# Patient Record
Sex: Male | Born: 1970 | Race: Black or African American | Hispanic: No | Marital: Single | Smoking: Never smoker
Health system: Southern US, Community
[De-identification: ages and names within clinical notes are randomized; demographics above are authoritative.]

## PROBLEM LIST (undated history)

## (undated) DIAGNOSIS — M109 Gout, unspecified: Secondary | ICD-10-CM

## (undated) DIAGNOSIS — E119 Type 2 diabetes mellitus without complications: Secondary | ICD-10-CM

## (undated) DIAGNOSIS — I10 Essential (primary) hypertension: Secondary | ICD-10-CM

## (undated) HISTORY — DX: Gout, unspecified: M10.9

## (undated) HISTORY — DX: Essential (primary) hypertension: I10

---

## 2008-02-13 ENCOUNTER — Emergency Department: Payer: Self-pay | Admitting: Emergency Medicine

## 2016-01-13 ENCOUNTER — Emergency Department
Admission: EM | Admit: 2016-01-13 | Discharge: 2016-01-13 | Disposition: A | Discharge status: Home / Self Care | Payer: BLUE CROSS/BLUE SHIELD | Attending: Emergency Medicine | Admitting: Emergency Medicine

## 2016-01-13 ENCOUNTER — Emergency Department: Payer: BLUE CROSS/BLUE SHIELD

## 2016-01-13 ENCOUNTER — Encounter: Payer: Self-pay | Admitting: Emergency Medicine

## 2016-01-13 DIAGNOSIS — M79661 Pain in right lower leg: Secondary | ICD-10-CM

## 2016-01-13 DIAGNOSIS — R252 Cramp and spasm: Secondary | ICD-10-CM

## 2016-01-13 DIAGNOSIS — E119 Type 2 diabetes mellitus without complications: Secondary | ICD-10-CM | POA: Insufficient documentation

## 2016-01-13 HISTORY — DX: Type 2 diabetes mellitus without complications: E11.9

## 2016-01-13 LAB — CBC WITH DIFFERENTIAL/PLATELET
BASOS ABS: 0.1 10*3/uL (ref 0–0.1)
Basophils Relative: 1 %
EOS ABS: 0.2 10*3/uL (ref 0–0.7)
Eosinophils Relative: 2 %
HCT: 41.5 % (ref 40.0–52.0)
Hemoglobin: 13.5 g/dL (ref 13.0–18.0)
LYMPHS ABS: 1.5 10*3/uL (ref 1.0–3.6)
LYMPHS PCT: 19 %
MCH: 24.2 pg — ABNORMAL LOW (ref 26.0–34.0)
MCHC: 32.5 g/dL (ref 32.0–36.0)
MCV: 74.3 fL — ABNORMAL LOW (ref 80.0–100.0)
MONO ABS: 0.8 10*3/uL (ref 0.2–1.0)
Monocytes Relative: 10 %
NEUTROS ABS: 5.3 10*3/uL (ref 1.4–6.5)
Neutrophils Relative %: 68 %
PLATELETS: 182 10*3/uL (ref 150–440)
RBC: 5.58 MIL/uL (ref 4.40–5.90)
RDW: 15.5 % — AB (ref 11.5–14.5)
WBC: 7.9 10*3/uL (ref 3.8–10.6)

## 2016-01-13 LAB — COMPREHENSIVE METABOLIC PANEL
ALBUMIN: 3.9 g/dL (ref 3.5–5.0)
ALK PHOS: 95 U/L (ref 38–126)
ALT: 26 U/L (ref 17–63)
ANION GAP: 10 (ref 5–15)
AST: 21 U/L (ref 15–41)
BILIRUBIN TOTAL: 0.4 mg/dL (ref 0.3–1.2)
BUN: 11 mg/dL (ref 6–20)
CALCIUM: 9.1 mg/dL (ref 8.9–10.3)
CO2: 29 mmol/L (ref 22–32)
CREATININE: 0.96 mg/dL (ref 0.61–1.24)
Chloride: 102 mmol/L (ref 101–111)
GFR calc Af Amer: >60 mL/min (ref >60)
GFR calc non Af Amer: >60 mL/min (ref >60)
GLUCOSE: 212 mg/dL — AB (ref 65–99)
Potassium: 3.7 mmol/L (ref 3.5–5.1)
Sodium: 141 mmol/L (ref 135–145)
TOTAL PROTEIN: 7.7 g/dL (ref 6.5–8.1)

## 2016-01-13 MED ORDER — CYCLOBENZAPRINE HCL 5 MG PO TABS: 5.0000 mg | ORAL_TABLET | Freq: Three times a day (TID) | ORAL | Status: DC | PRN

## 2016-01-13 NOTE — ED Notes (Signed)
Pt to ed with c/o right calf pain.  Pt states it started yesterday, feels like a cramp.  No bruising or swelling noted.

## 2016-01-13 NOTE — ED Provider Notes (Signed)
[facility] Emergency Department Provider Note  ____________________________________________  Time seen: Approximately 1:03 PM  I have reviewed the triage vital signs and the nursing notes.   HISTORY  Chief Complaint Leg Pain    HPI Brent Gibson is a 45 y.o. male, NAD, presents to the emergency department with a 1 day history of right calf pain. He states it feels like a cramp that will not subside. He has a history of gout and thought that this might be a different presentation of gout and took colchicine and indomethicin this morning. 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 He denies injury, traumas nor falls. Denies fever, chills, body aches. Has had no numbness, weakness, tingling, redness, swelling, chest pain, cough, or dyspnea. Is diabetic and notes he takes all but his metformin on a daily basis. Does not take the metformin due to diarrhea as a side effect. Last appointment with his PCP was late March 2017.    Past Medical History  Diagnosis Date  . Diabetes mellitus without complication (HCC)     There are no active problems to display for this patient.   History reviewed. No pertinent past surgical history.  Current Outpatient Rx  Name  Route  Sig  Dispense  Refill  . cyclobenzaprine (FLEXERIL) 5 MG tablet   Oral   Take 1 tablet (5 mg total) by mouth every 8 (eight) hours as needed for muscle spasms.   21 tablet   0     Allergies Review of patient's allergies indicates no known allergies.  History reviewed. No pertinent family history.  Social History Social History  Substance Use Topics  . Smoking status: Never Smoker   . Smokeless tobacco: None  . Alcohol Use: No     Review of Systems  Constitutional: No fever/chills, fatigue Eyes: No visual changes.  Cardiovascular: No chest pain, palpitations. Respiratory: No cough. No shortness of breath. No wheezing.  Gastrointestinal: No abdominal pain.  No  nausea, vomiting.   Musculoskeletal: Positive for right calf pain. Negative for back pain, ankle pain, foot pain.  Skin: Negative for rash, redness, swelling, skin sores. Neurological: Negative for headaches, focal weakness or numbness. No tingling.    ____________________________________________   PHYSICAL EXAM:  VITAL SIGNS: ED Triage Vitals  Enc Vitals Group     BP 01/13/16 1132 177/87 mmHg     Pulse --      Resp 01/13/16 1132 18     Temp 01/13/16 1132 99.1 F (37.3 C)     Temp Source 01/13/16 1132 Oral     SpO2 01/13/16 1132 94 %     Weight 01/13/16 1132 375 lb (170.099 kg)     Height 01/13/16 1132 6' (1.829 m)     Head Cir --      Peak Flow --      Pain Score 01/13/16 1155 10     Pain Loc --      Pain Edu? --      Excl. in GC? --     Constitutional: Alert and oriented. Well appearing and in no acute distress. Eyes: Conjunctivae are normal. PERRL. EOMI without pain.  Head: Atraumatic. Hematological/Lymphatic/Immunilogical: No cervical lymphadenopathy. Cardiovascular: Normal rate, regular rhythm. Normal S1 and S2.  Right foot was slightly cooler in temperature as compared to the left foot. Pulses difficult to palpate in the right foot but noted to be 1+. Capillary refill was <3 seconds in the right foot, toes and ankle.  Respiratory: Normal respiratory effort without tachypnea or retractions. Lungs CTAB with breath sounds noted in all lung fields. Musculoskeletal: No tenderness to palpation of bilateral lower extremities. Pain upon weight bearing and walking in the right calf. Full ROM of motion of the right toes, foot, ankle, knee, hip without pain. Negative hoaman's sign. No edema or erythema noted in either leg. No joint effusions. Neurologic:  Normal speech and language. No gross focal neurologic deficits are appreciated. Gait with slight limp favoring right leg. Sensation to light touch grossly in tact about the right lower extremity.  Skin:  Skin is warm, dry and  intact. No rash, skin sores, open wounds noted. Psychiatric: Mood and affect are normal. Speech and behavior are normal. Patient exhibits appropriate insight and judgement.  ____________________________________________   LABS (all labs ordered are listed, but only abnormal results are displayed)  Labs Reviewed  COMPREHENSIVE METABOLIC PANEL - Abnormal; Notable for the following:    Glucose, Bld 212 (*)    All other components within normal limits  CBC WITH DIFFERENTIAL/PLATELET - Abnormal; Notable for the following:    MCV 74.3 (*)    MCH 24.2 (*)    RDW 15.5 (*)    All other components within normal limits   ____________________________________________  EKG  None ____________________________________________  RADIOLOGY I have personally viewed and evaluated these images (plain radiographs) as part of my medical decision making, as well as reviewing the written report by the radiologist.  US Venous Img Lower Unilateral Right  01/13/2016  CLINICAL DATA:  45 year old male with a history of right calf pain EXAM: RIGHT LOWER EXTREMITY VENOUS DOPPLER ULTRASOUND TECHNIQUE: Gray-scale sonography with graded compression, as well as color Doppler and duplex ultrasound were performed to evaluate the lower extremity deep venous systems from the level of the common femoral vein and including the common femoral, femoral, profunda femoral, popliteal and calf veins including the posterior tibial, peroneal and gastrocnemius veins when visible. The superficial great saphenous vein was also interrogated. Spectral Doppler was utilized to evaluate flow at rest and with distal augmentation maneuvers in the common femoral, femoral and popliteal veins. COMPARISON:  None. FINDINGS: Contralateral Common Femoral Vein: Respiratory phasicity is normal and symmetric with the symptomatic side. No evidence of thrombus. Normal compressibility. Common Femoral Vein: No evidence of thrombus. Normal compressibility,  respiratory phasicity and response to augmentation. Saphenofemoral Junction: No evidence of thrombus. Normal compressibility and flow on color Doppler imaging. Profunda Femoral Vein: No evidence of thrombus. Normal compressibility and flow on color Doppler imaging. Femoral Vein: No evidence of thrombus. Normal compressibility, respiratory phasicity and response to augmentation. Popliteal Vein: No evidence of thrombus. Normal compressibility, respiratory phasicity and response to augmentation. Calf Veins: No evidence of thrombus. Normal compressibility and flow on color Doppler imaging. Superficial Great Saphenous Vein: No evidence of thrombus. Normal compressibility and flow on color Doppler imaging. Other Findings:  None. IMPRESSION: Sonographic survey of the right lower extremity negative for DVT. Signed, Yvone Neu. Loreta Ave, DO [facility] Electronically Signed   By: Gilmer Mor D.O.   On: 01/13/2016 14:51    ____________________________________________   INITIAL IMPRESSION / ASSESSMENT AND PLAN / ED COURSE  Pertinent labs & imaging results that were available during my care of the patient were reviewed by me and considered in my medical decision making (see chart for details).  Patient's diagnosis is consistent with muscle cramp and right calf pain. Patient will be discharged home with prescriptions for Flexeril  to take  as directed. 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 Patient is to follow up with his PCP if symptoms persist past this treatment course. Patient is given ED precautions to return to the ED for any worsening or new symptoms.    ____________________________________________  FINAL CLINICAL IMPRESSION(S) / ED DIAGNOSES  Final diagnoses:  Muscle cramps  Calf pain, right      NEW MEDICATIONS STARTED DURING THIS VISIT:  Discharge Medication List as of  01/13/2016  2:56 PM    START taking these medications   Details  cyclobenzaprine (FLEXERIL) 5 MG tablet Take 1 tablet (5 mg total) by mouth every 8 (eight) hours as needed for muscle spasms., Starting 01/13/2016, Until Discontinued, Print             Hope PigeonJami L Anakin Varkey, PA-C 01/13/16 1534  Rockne MenghiniAnne-Caroline Norman, MD 01/16/16 [phone]

## 2016-01-13 NOTE — Discharge Instructions (Signed)
Leg Cramps Leg cramps occur when a muscle or muscles tighten and you have no control over this tightening (involuntary muscle contraction). Muscle cramps can develop in any muscle, but the most common place is in the calf muscles of the leg. Those cramps can occur during exercise or when you are at rest. Leg cramps are painful, and they may last for a few seconds to a few minutes. Cramps may return several times before they finally stop. Usually, leg cramps are not caused by a serious medical problem. In many cases, the cause is not known. Some common causes include:  Overexertion.  Overuse from repetitive motions, or doing the same thing over and over.  Remaining in a certain position for a long period of time.  Improper preparation, form, or technique while performing a sport or an activity.  Dehydration.  Injury.  Side effects of some medicines.  Abnormally low levels of the salts and ions in your blood (electrolytes), especially potassium and calcium. These levels could be low if you are taking water pills (diuretics) or if you are pregnant. HOME CARE INSTRUCTIONS Watch your condition for any changes. Taking the following actions may help to lessen any discomfort that you are feeling:  Stay well-hydrated. Drink enough fluid to keep your urine clear or pale yellow.  Try massaging, stretching, and relaxing the affected muscle. Do this for several minutes at a time.  For tight or tense muscles, use a warm towel, heating pad, or hot shower water directed to the affected area.  If you are sore or have pain after a cramp, applying ice to the affected area may relieve discomfort.  Put ice in a plastic bag.  Place a towel between your skin and the bag.  Leave the ice on for 20 minutes, 2-3 times per day.  Avoid strenuous exercise for several days if you have been having frequent leg cramps.  Make sure that your diet includes the essential minerals for your muscles to work  normally.  Take medicines only as directed by your health care provider. SEEK MEDICAL CARE IF:  Your leg cramps get more severe or more frequent, or they do not improve over time.  Your foot becomes cold, numb, or blue.   This information is not intended to replace advice given to you by your health care provider. Make sure you discuss any questions you have with your health care provider.   Document Released: 10/07/2004 Document Revised: 01/14/2015 Document Reviewed: 08/07/2014 Elsevier Interactive Patient Education 2016 Elsevier Inc.  [employer] Therapy Heat therapy can help ease sore, stiff, injured, and tight muscles and joints. Heat relaxes your muscles, which may help ease your pain. Heat therapy should only be used on old, pre-existing, or long-lasting (chronic) injuries. Do not use heat therapy unless told by your doctor. HOW TO USE HEAT THERAPY There are several different kinds of heat therapy, including:  Moist heat pack.  Warm water bath.  Hot water bottle.  Electric heating pad.  Heated gel pack.  Heated wrap.  Electric heating pad. GENERAL HEAT THERAPY RECOMMENDATIONS   Do not sleep while using heat therapy. Only use heat therapy while you are awake.  Your skin may turn pink while using heat therapy. Do not use heat therapy if your skin turns red.  Do not use heat therapy if you have new pain.  High heat or long exposure to heat can cause burns. Be careful when using heat therapy to avoid burning your skin.  Do not use heat  therapy on areas of your skin that are already irritated, such as with a rash or sunburn. GET HELP IF:   You have blisters, redness, swelling (puffiness), or numbness.  You have new pain.  Your pain is worse. MAKE SURE YOU:  Understand these instructions.  Will watch your condition.  Will get help right away if you are not doing well or get worse.   This information is not intended to replace advice given to you by your health care  provider. Make sure you discuss any questions you have with your health care provider.   Document Released: 11/22/2011 Document Revised: 09/20/2014 Document Reviewed: 10/23/2013 Elsevier Interactive Patient Education [employer] Elsevier Inc.

## 2016-01-13 NOTE — ED Notes (Signed)
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  No warmth, redness, swelling noted to area.

## 2016-01-19 ENCOUNTER — Encounter: Payer: Self-pay | Admitting: Emergency Medicine

## 2016-01-19 ENCOUNTER — Emergency Department
Admission: EM | Admit: 2016-01-19 | Discharge: 2016-01-19 | Disposition: A | Discharge status: Home / Self Care | Payer: BLUE CROSS/BLUE SHIELD | Attending: Emergency Medicine | Admitting: Emergency Medicine

## 2016-01-19 DIAGNOSIS — M79604 Pain in right leg: Secondary | ICD-10-CM | POA: Diagnosis present

## 2016-01-19 DIAGNOSIS — E119 Type 2 diabetes mellitus without complications: Secondary | ICD-10-CM | POA: Insufficient documentation

## 2016-01-19 DIAGNOSIS — L03115 Cellulitis of right lower limb: Secondary | ICD-10-CM | POA: Insufficient documentation

## 2016-01-19 LAB — CBC WITH DIFFERENTIAL/PLATELET
Basophils Absolute: 0 10*3/uL (ref 0–0.1)
Eosinophils Absolute: 0.1 10*3/uL (ref 0–0.7)
HEMATOCRIT: 41.9 % (ref 40.0–52.0)
Hemoglobin: 13.5 g/dL (ref 13.0–18.0)
Lymphocytes Relative: 11 %
Lymphs Abs: 1.3 10*3/uL (ref 1.0–3.6)
MCH: 23.9 pg — ABNORMAL LOW (ref 26.0–34.0)
MCHC: 32.1 g/dL (ref 32.0–36.0)
MCV: 74.5 fL — AB (ref 80.0–100.0)
MONO ABS: 1.3 10*3/uL — AB (ref 0.2–1.0)
NEUTROS ABS: 9.2 10*3/uL — AB (ref 1.4–6.5)
Neutrophils Relative %: 77 %
PLATELETS: 223 10*3/uL (ref 150–440)
RBC: 5.62 MIL/uL (ref 4.40–5.90)
RDW: 15.2 % — AB (ref 11.5–14.5)
WBC: 11.9 10*3/uL — ABNORMAL HIGH (ref 3.8–10.6)

## 2016-01-19 LAB — COMPREHENSIVE METABOLIC PANEL
ALBUMIN: 3.5 g/dL (ref 3.5–5.0)
ALK PHOS: 117 U/L (ref 38–126)
ALT: 40 U/L (ref 17–63)
ANION GAP: 11 (ref 5–15)
AST: 37 U/L (ref 15–41)
BILIRUBIN TOTAL: 0.8 mg/dL (ref 0.3–1.2)
BUN: 16 mg/dL (ref 6–20)
CALCIUM: 9.1 mg/dL (ref 8.9–10.3)
CO2: 31 mmol/L (ref 22–32)
Chloride: 93 mmol/L — ABNORMAL LOW (ref 101–111)
Creatinine, Ser: 1.19 mg/dL (ref 0.61–1.24)
GFR calc Af Amer: >60 mL/min (ref >60)
GFR calc non Af Amer: >60 mL/min (ref >60)
GLUCOSE: 299 mg/dL — AB (ref 65–99)
Potassium: 3.8 mmol/L (ref 3.5–5.1)
SODIUM: 135 mmol/L (ref 135–145)
TOTAL PROTEIN: 8.5 g/dL — AB (ref 6.5–8.1)

## 2016-01-19 LAB — MAGNESIUM: Magnesium: 2.1 mg/dL (ref 1.7–2.4)

## 2016-01-19 MED ORDER — OXYCODONE-ACETAMINOPHEN 5-325 MG PO TABS: 1.0000 | ORAL_TABLET | ORAL | Status: DC | PRN

## 2016-01-19 MED ORDER — OXYCODONE-ACETAMINOPHEN 5-325 MG PO TABS
2.0000 | ORAL_TABLET | Freq: Once | ORAL | Status: AC
  Administered 2016-01-19: 2 via ORAL
  Filled 2016-01-19: qty 2

## 2016-01-19 MED ORDER — CLINDAMYCIN HCL 150 MG PO CAPS: 300.0000 mg | ORAL_CAPSULE | Freq: Four times a day (QID) | ORAL | Status: DC

## 2016-01-19 NOTE — ED Notes (Signed)
Reports pain and swelling in right calf.  +PMS

## 2016-01-19 NOTE — ED Notes (Signed)
Was seen last week with pain to right lower leg  Had u/s done at that time  Which was negative. Now having pain and increased swelling to lower leg

## 2016-01-19 NOTE — ED Provider Notes (Signed)
[facility] Emergency Department Provider Note   ____________________________________________  Time seen: Approximately 4:52 PM  I have reviewed the triage vital signs and the nursing notes.   HISTORY  Chief Complaint Leg Pain   HPI Brent Gibson is a 45 y.o. male is here with complaint of right lower leg pain. Patient states he was seen in the emergency room on 5/2 at which time he had ultrasound tests that ruled out a blood clot in his leg. Patient has taken Flexeril without any relief. Patient reports that his leg feels "like I'm having a cramp". He denies any trauma or previous problems with his leg. Patient states he has a history of gout however he points to the catheter of his leg when he is describing his pain.Patient denies any fever or chills. There's been no nausea or vomiting. He denies any shortness of breath. Initially he took colchicine and indomethacin thinking that this was gout which did not give him any relief. Patient is non-insulin-dependent diabetic and takes       Currently he rates his pain as a 10 over 10 and he has not followed up with his primary care doctor since his visit in the emergency room.   Past Medical History  Diagnosis Date  . Diabetes mellitus without complication (HCC)     There are no active problems to display for this patient.   History reviewed. No pertinent past surgical history.  Current Outpatient Rx  Name  Route  Sig  Dispense  Refill  . clindamycin (CLEOCIN) 150 MG capsule   Oral   Take 2 capsules (300 mg total) by mouth 4 (four) times daily.   56 capsule   0   . cyclobenzaprine (FLEXERIL) 5 MG tablet   Oral   Take 1 tablet (5 mg total) by mouth every 8 (eight) hours as needed for muscle spasms.   21 tablet   0   . oxyCODONE-acetaminophen (PERCOCET) 5-325 MG tablet   Oral   Take 1 tablet by mouth every 4 (four) hours as needed for severe pain.   20 tablet   0     Allergies Review of  patient's allergies indicates no known allergies.  History reviewed. No pertinent family history.  Social History Social History  Substance Use Topics  . Smoking status: Never Smoker   . Smokeless tobacco: None  . Alcohol Use: No    Review of Systems Constitutional: No fever/chills Cardiovascular: Denies chest pain. Respiratory: Denies shortness of breath. Gastrointestinal: No abdominal pain.  No nausea, no vomiting.  No diarrhea.  No constipation. Genitourinary: Negative for dysuria. Musculoskeletal: Negative for back pain. Positive for right leg pain. Skin: Negative for rash. Neurological: Negative for headaches, focal weakness. Positive for diabetic neuropathy bilateral feet.  10-point ROS otherwise negative.  ____________________________________________   PHYSICAL EXAM:  VITAL SIGNS: ED Triage Vitals  Enc Vitals Group     BP 01/19/16 1641 146/89 mmHg     Pulse Rate 01/19/16 1641 123     Resp 01/19/16 1641 20     Temp 01/19/16 1641 98.9 F (37.2 C)     Temp src --      SpO2 --      Weight 01/19/16 1641 375 lb (170.099 kg)     Height 01/19/16 1641 6' (1.829 m)     Head Cir --      Peak Flow --      Pain Score 01/19/16 1642 10     Pain Loc --  Pain Edu? --      Excl. in GC? --     Constitutional: Alert and oriented. Well appearing and in no acute distress. Eyes: Conjunctivae are normal. PERRL. EOMI. Head: Atraumatic. Nose: No congestion/rhinnorhea. Neck: No stridor.   Cardiovascular: Normal rate, regular rhythm. Grossly normal heart sounds.  Good peripheral circulation. Respiratory: Normal respiratory effort.  No retractions. Lungs CTAB. Gastrointestinal: Soft and nontender. No distention. No abdominal bruits.  Musculoskeletal:Right leg no gross deformity. There is minimal swelling in comparison to the left calf however there is some slight warmth to the skin. There is no increased pain on pressure and palpation. Homans sign is negative. There is no skin  eruption or drainage noted. There is no obvious infection of the right foot. Neurologic:  Normal speech and language. Mild neurological deficit bilateral feet secondary to diabetic neuropathy. Skin:  Skin is warm, dry and intact. No rash noted.No ecchymosis, abrasions. There is some mild warmth noted to the right calf without skin injury obvious. Psychiatric: Mood and affect are normal. Speech and behavior are normal.  ____________________________________________   LABS (all labs ordered are listed, but only abnormal results are displayed)  Labs Reviewed  COMPREHENSIVE METABOLIC PANEL - Abnormal; Notable for the following:    Chloride 93 (*)    Glucose, Bld 299 (*)    Total Protein 8.5 (*)    All other components within normal limits  CBC WITH DIFFERENTIAL/PLATELET - Abnormal; Notable for the following:    WBC 11.9 (*)    MCV 74.5 (*)    MCH 23.9 (*)    RDW 15.2 (*)    Neutro Abs 9.2 (*)    Monocytes Absolute 1.3 (*)    All other components within normal limits  MAGNESIUM    PROCEDURES  Procedure(s) performed: None  Critical Care performed: No  ____________________________________________   INITIAL IMPRESSION / ASSESSMENT AND PLAN / ED COURSE  Pertinent labs & imaging results that were available during my care of the patient were reviewed by me and considered in my medical decision making (see chart for details).  ----------------------------------------- 7:02 PM on 01/19/2016 ----------------------------------------- Patient states that pain medication has relieved his pain considerably. Patient was informed of lab work and the elevation in his white count.Patient had ultrasound evaluation of his right leg which was negative for DVT. He is instructed to follow-up with his primary doctor at [facility] clinic this week. He is aware that he may return to the emergency room if any severe worsening of his symptoms or fever above 101. Patient was placed on clindamycin 300 mg 4 times  a day and Percocet as needed for pain. He was instructed to use warm compresses and elevate when possible.  ____________________________________________   FINAL CLINICAL IMPRESSION(S) / ED DIAGNOSES  Final diagnoses:  Cellulitis of right leg without foot      NEW MEDICATIONS STARTED DURING THIS VISIT:  Discharge Medication List as of 01/19/2016  6:56 PM    START taking these medications   Details  clindamycin (CLEOCIN) 150 MG capsule Take 2 capsules (300 mg total) by mouth 4 (four) times daily., Starting 01/19/2016, Until Discontinued, Print    oxyCODONE-acetaminophen (PERCOCET) 5-325 MG tablet Take 1 tablet by mouth every 4 (four) hours as needed for severe pain., Starting 01/19/2016, Until Discontinued, Print         Note:  This document was prepared using Dragon voice recognition software and may include unintentional dictation errors.    Tommi Rumps, PA-C 01/19/16 1920  Loraine Leriche  Fanny BienQuale, MD 01/19/16 2153

## 2016-01-19 NOTE — Discharge Instructions (Signed)
Cellulitis Cellulitis is an infection of the skin and the tissue under the skin. The infected area is usually red and tender. This happens most often in the arms and lower legs. HOME CARE   Take your antibiotic medicine as told. Finish the medicine even if you start to feel better.  Keep the infected arm or leg raised (elevated).  Put a warm cloth on the area up to 4 times per day.  Only take medicines as told by your doctor.  Keep all doctor visits as told. GET HELP IF:  You see red streaks on the skin coming from the infected area.  Your red area gets bigger or turns a dark color.  Your bone or joint under the infected area is painful after the skin heals.  Your infection comes back in the same area or different area.  You have a puffy (swollen) bump in the infected area.  You have new symptoms.  You have a fever. GET HELP RIGHT AWAY IF:   You feel very sleepy.  You throw up (vomit) or have watery poop (diarrhea).  You feel sick and have muscle aches and pains.   This information is not intended to replace advice given to you by your health care provider. Make sure you discuss any questions you have with your health care provider.   Document Released: 02/16/2008 Document Revised: 05/21/2015 Document Reviewed: 11/15/2011 Elsevier Interactive Patient Education 2016 [employer]    [facility]. Warm compresses to your leg frequently. Begin taking clindamycin for infection. Percocet as needed for pain. Return to the emergency room today severe worsening of your symptoms or fever above 101.

## 2016-06-30 IMAGING — US US EXTREM LOW VENOUS*R*
1 series · 13 of 24 positions shown · non-contrast
Comparison: None.

CLINICAL DATA: 45-year-old male with a history of right calf pain



[Series 1: us extrem low venous*right* · 0.11mm/px · 13 of 34 slices shown]
[im 1/34]
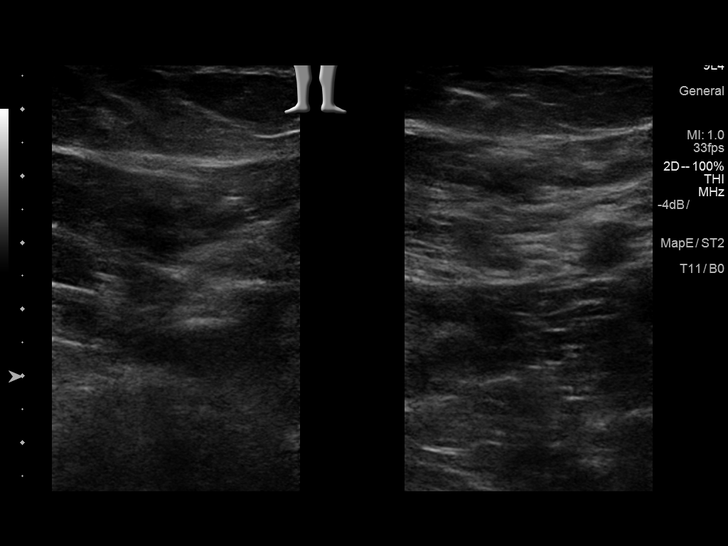
[im 3/34]
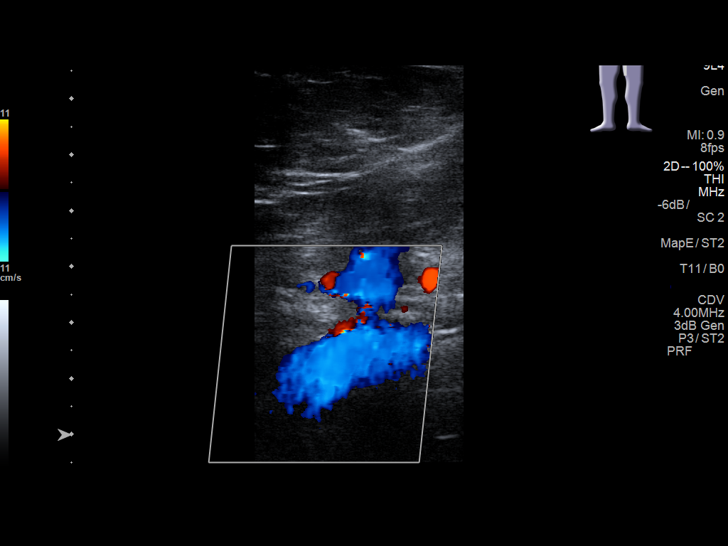
[im 6/34]
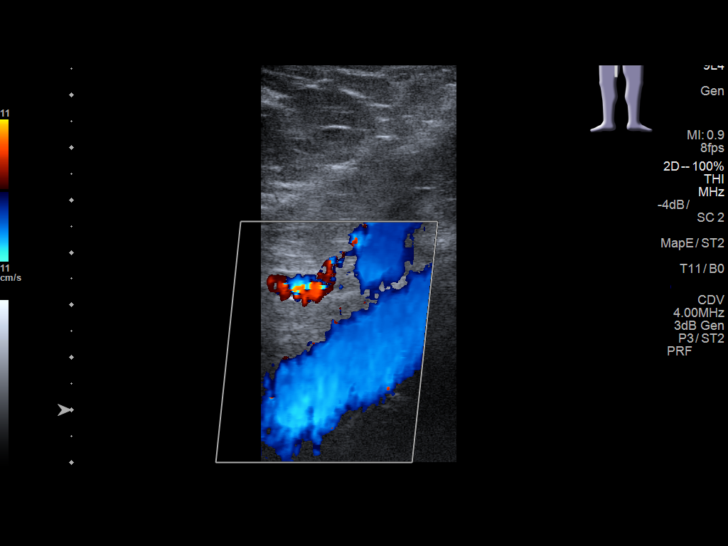
[im 9/34]
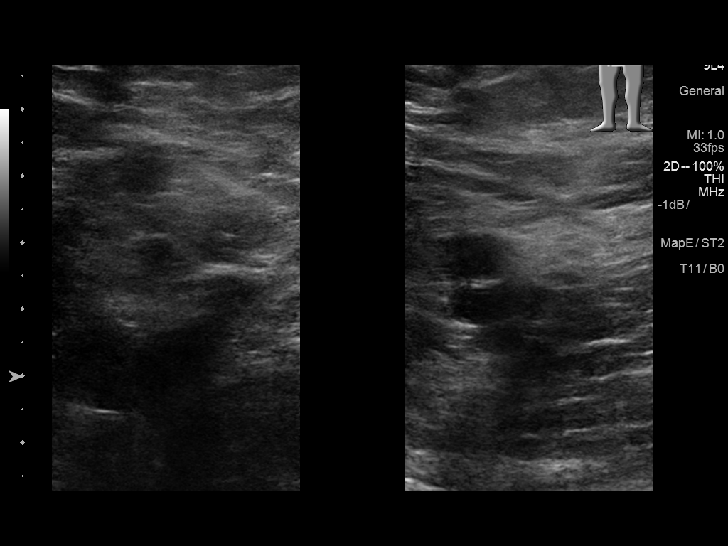
[im 12/34]
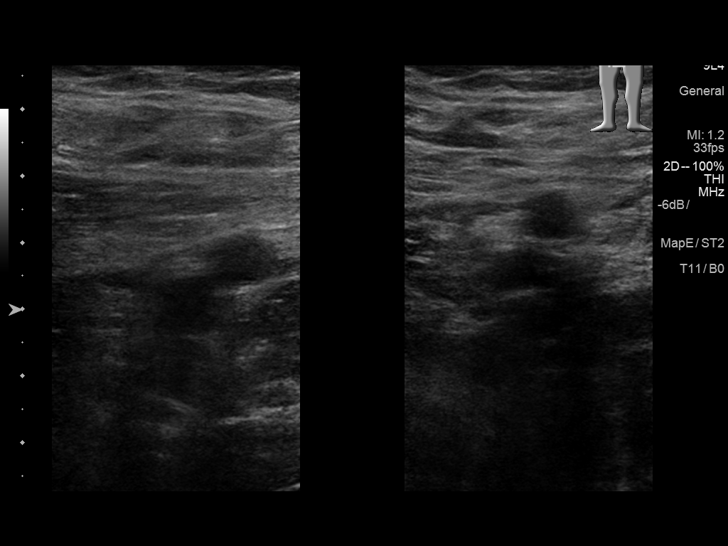
[im 15/34]
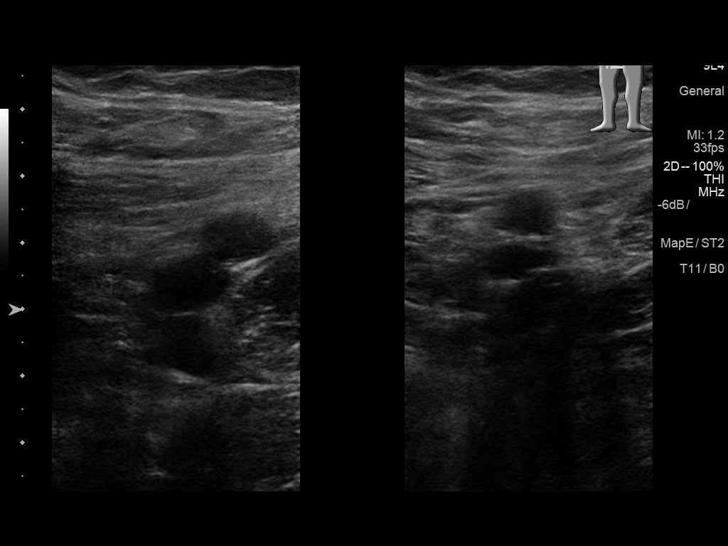
[im 18/34]
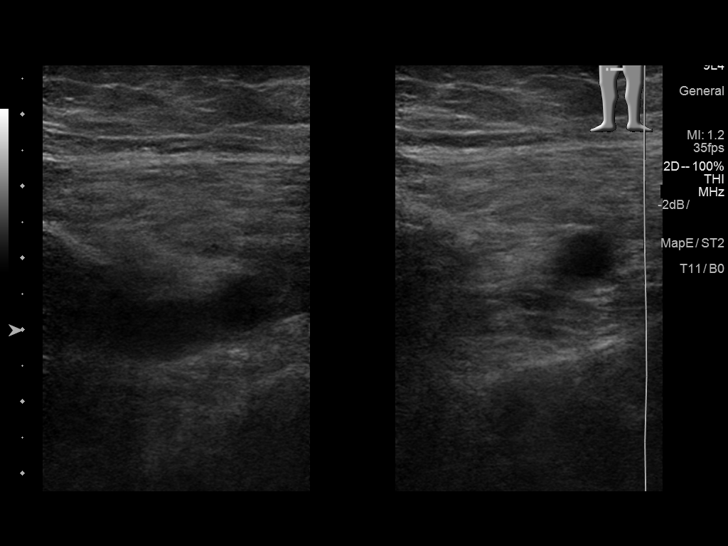
[im 19/34]
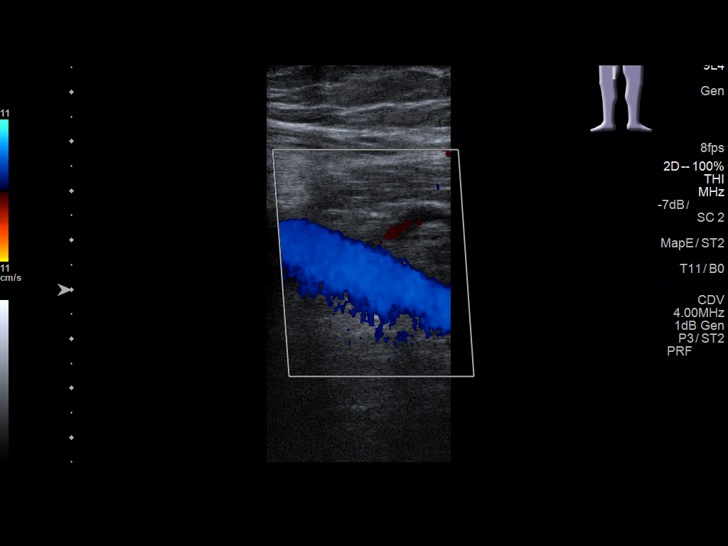
[im 22/34]
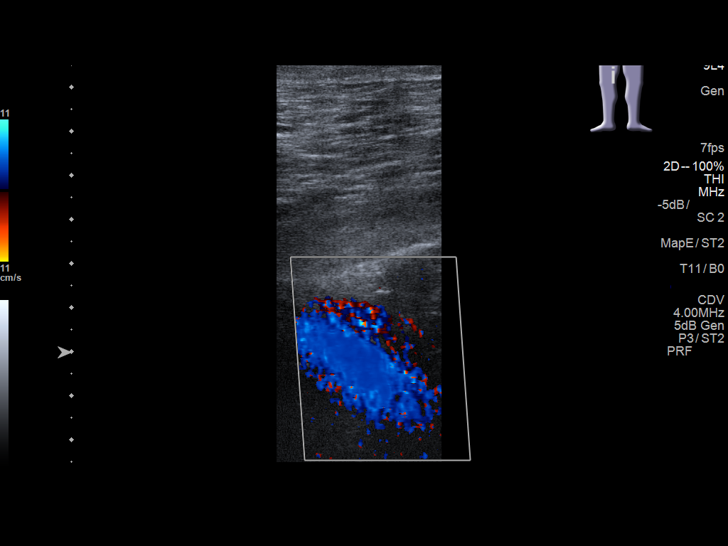
[im 25/34]
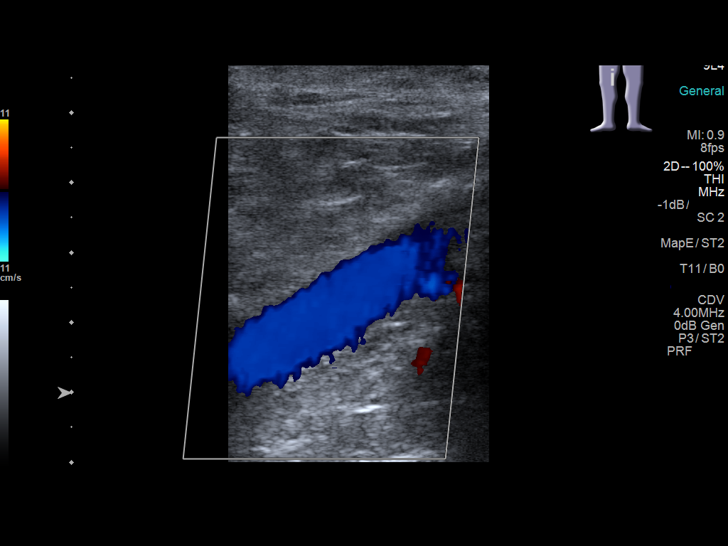
[im 28/34]
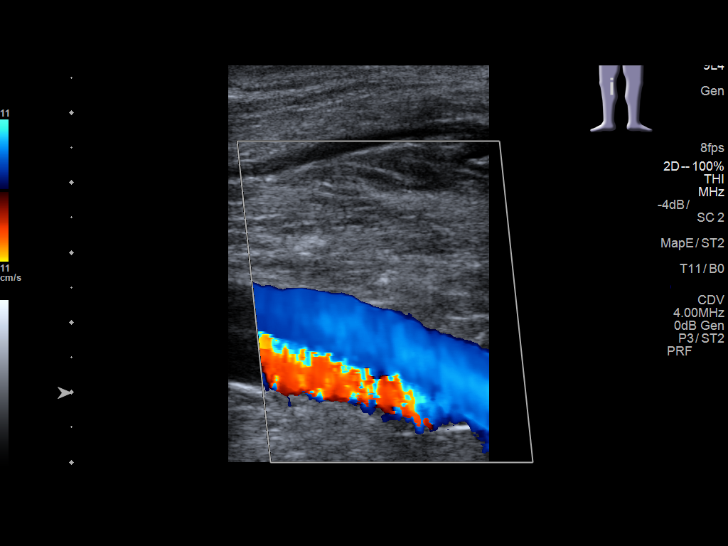
[im 31/34]
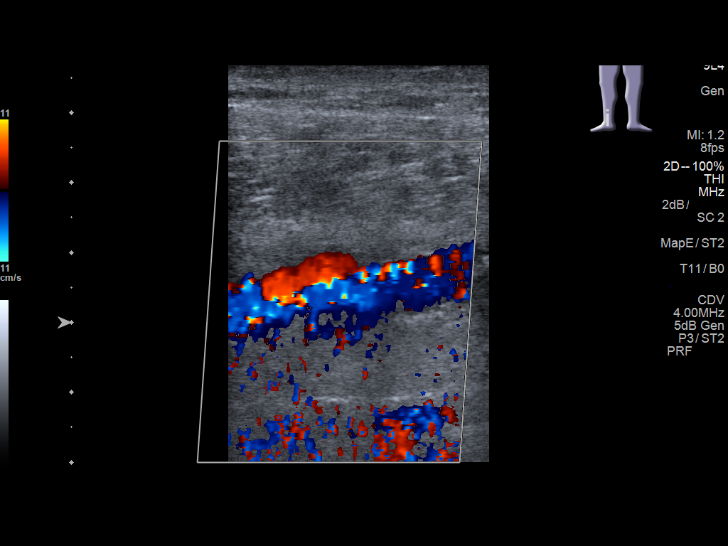
[im 34/34]
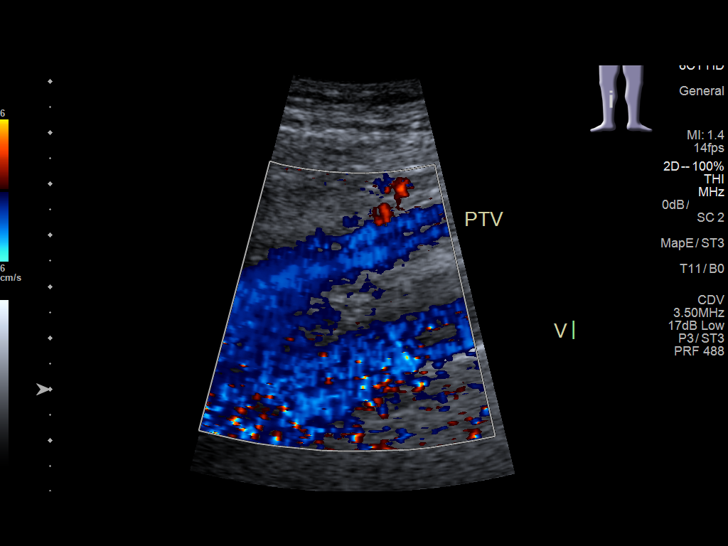

[13 of 24 positions shown; findings below may reference images not displayed]

FINDINGS: Contralateral Common Femoral Vein: Respiratory phasicity is normal
and symmetric with the symptomatic side. No evidence of thrombus.
Normal compressibility.

Common Femoral Vein: No evidence of thrombus. Normal
compressibility, respiratory phasicity and response to augmentation.

Saphenofemoral Junction: No evidence of thrombus. Normal
compressibility and flow on color Doppler imaging.

Profunda Femoral Vein: No evidence of thrombus. Normal
compressibility and flow on color Doppler imaging.

Femoral Vein: No evidence of thrombus. Normal compressibility,
respiratory phasicity and response to augmentation.

Popliteal Vein: No evidence of thrombus. Normal compressibility,
respiratory phasicity and response to augmentation.

Calf Veins: No evidence of thrombus. Normal compressibility and flow
on color Doppler imaging.

Superficial Great Saphenous Vein: No evidence of thrombus. Normal
compressibility and flow on color Doppler imaging.

Other Findings:  None.
IMPRESSION: Sonographic survey of the right lower extremity negative for DVT.

## 2016-10-29 ENCOUNTER — Ambulatory Visit: Payer: BLUE CROSS/BLUE SHIELD | Attending: Family Medicine

## 2016-10-29 DIAGNOSIS — Z79899 Other long term (current) drug therapy: Secondary | ICD-10-CM | POA: Diagnosis not present

## 2016-10-29 DIAGNOSIS — Z7984 Long term (current) use of oral hypoglycemic drugs: Secondary | ICD-10-CM | POA: Diagnosis not present

## 2016-10-29 DIAGNOSIS — G4733 Obstructive sleep apnea (adult) (pediatric): Secondary | ICD-10-CM | POA: Diagnosis not present

## 2016-10-29 DIAGNOSIS — Z6841 Body Mass Index (BMI) 40.0 and over, adult: Secondary | ICD-10-CM | POA: Diagnosis not present

## 2016-10-29 DIAGNOSIS — I10 Essential (primary) hypertension: Secondary | ICD-10-CM | POA: Insufficient documentation

## 2016-10-29 DIAGNOSIS — E119 Type 2 diabetes mellitus without complications: Secondary | ICD-10-CM | POA: Insufficient documentation

## 2020-01-11 ENCOUNTER — Ambulatory Visit: Payer: Self-pay | Attending: Internal Medicine

## 2020-01-11 DIAGNOSIS — Z23 Encounter for immunization: Secondary | ICD-10-CM

## 2020-01-11 NOTE — Progress Notes (Signed)
   Covid-19 Vaccination Clinic  Name:  Brent Gibson    MRN: [id number] DOB: 1971-03-21  01/11/2020  [name] was observed post Covid-19 immunization for 15 minutes without incident. He was provided with Vaccine Information Sheet and instruction to access the V-Safe system.   [name] was instructed to call 911 with any severe reactions post vaccine: Marland Kitchen Difficulty breathing  . Swelling of face and throat  . A fast heartbeat  . A bad rash all over body  . Dizziness and weakness   Immunizations Administered    Name Date Dose VIS Date Route   Pfizer COVID-19 Vaccine 01/11/2020  7:59 AM 0.3 mL 11/07/2018 Intramuscular   Manufacturer: [employer], [employer]   Lot: [id number]   NDC: [address]-3

## 2020-02-05 ENCOUNTER — Ambulatory Visit: Payer: Self-pay | Attending: Internal Medicine

## 2020-02-05 DIAGNOSIS — Z23 Encounter for immunization: Secondary | ICD-10-CM

## 2020-02-05 NOTE — Progress Notes (Signed)
   Covid-19 Vaccination Clinic  Name:  BODI PALMERI    MRN: [id number] DOB: [birth year]  02/05/2020  Mr. Retana was observed post Covid-19 immunization for 15 minutes without incident. He was provided with Vaccine Information Sheet and instruction to access the V-Safe system.   Mr. Rodenberg was instructed to call 911 with any severe reactions post vaccine: Marland Kitchen Difficulty breathing  . Swelling of face and throat  . A fast heartbeat  . A bad rash all over body  . Dizziness and weakness   Immunizations Administered    Name Date Dose VIS Date Route   Pfizer COVID-19 Vaccine 02/05/2020  8:17 AM 0.3 mL 11/07/2018 Intramuscular   Manufacturer: [employer], [employer]   Lot: [id number]   NDC: [address]-9

## 2020-02-29 ENCOUNTER — Other Ambulatory Visit: Payer: Self-pay

## 2020-02-29 ENCOUNTER — Emergency Department
Admission: EM | Admit: 2020-02-29 | Discharge: 2020-02-29 | Disposition: A | Discharge status: Home / Self Care | Payer: BC Managed Care – PPO | Attending: Emergency Medicine | Admitting: Emergency Medicine

## 2020-02-29 ENCOUNTER — Encounter: Payer: Self-pay | Admitting: Emergency Medicine

## 2020-02-29 DIAGNOSIS — M7918 Myalgia, other site: Secondary | ICD-10-CM

## 2020-02-29 DIAGNOSIS — E119 Type 2 diabetes mellitus without complications: Secondary | ICD-10-CM | POA: Diagnosis not present

## 2020-02-29 DIAGNOSIS — M791 Myalgia, unspecified site: Secondary | ICD-10-CM | POA: Insufficient documentation

## 2020-02-29 DIAGNOSIS — Z7984 Long term (current) use of oral hypoglycemic drugs: Secondary | ICD-10-CM | POA: Diagnosis not present

## 2020-02-29 DIAGNOSIS — Y999 Unspecified external cause status: Secondary | ICD-10-CM | POA: Insufficient documentation

## 2020-02-29 DIAGNOSIS — Y93I9 Activity, other involving external motion: Secondary | ICD-10-CM | POA: Diagnosis not present

## 2020-02-29 DIAGNOSIS — M545 Low back pain: Secondary | ICD-10-CM | POA: Diagnosis present

## 2020-02-29 DIAGNOSIS — Y92410 Unspecified street and highway as the place of occurrence of the external cause: Secondary | ICD-10-CM | POA: Insufficient documentation

## 2020-02-29 MED ORDER — IBUPROFEN 600 MG PO TABS: 600.0000 mg | ORAL_TABLET | Freq: Three times a day (TID) | ORAL | 0 refills | Status: DC | PRN

## 2020-02-29 MED ORDER — CYCLOBENZAPRINE HCL 10 MG PO TABS: 10.0000 mg | ORAL_TABLET | Freq: Three times a day (TID) | ORAL | 0 refills | Status: AC | PRN

## 2020-02-29 MED ORDER — TRAMADOL HCL 50 MG PO TABS: 50.0000 mg | ORAL_TABLET | Freq: Four times a day (QID) | ORAL | 0 refills | Status: DC | PRN
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
## 2020-02-29 NOTE — Discharge Instructions (Signed)
Follow discharge care instructions and take medication as directed.  Do not drive or operate machinery while taking tramadol and Flexeril.
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
## 2023-03-21 LAB — EXTERNAL GENERIC LAB PROCEDURE: COLOGUARD: NEGATIVE

## 2023-03-21 LAB — COLOGUARD: COLOGUARD: NEGATIVE

## 2023-10-20 ENCOUNTER — Ambulatory Visit: Payer: Self-pay | Admitting: Family Medicine

## 2023-10-20 ENCOUNTER — Encounter: Payer: Self-pay | Admitting: Family Medicine

## 2023-10-20 DIAGNOSIS — Z113 Encounter for screening for infections with a predominantly sexual mode of transmission: Secondary | ICD-10-CM

## 2023-10-20 LAB — HM HIV SCREENING LAB: HM HIV Screening: NEGATIVE

## 2023-10-20 NOTE — Progress Notes (Signed)
 Pt is here for STD screening.  Condoms declined.  Berdie Ogren, RN

## 2023-10-20 NOTE — Progress Notes (Signed)
 [facility] Department STI clinic [address] [place] [address] Main phone: [phone]  STI screening visit  Subjective:  Brent Gibson is a 53 y.o. male being seen today for an STI screening visit. The patient reports they do not have symptoms.    Patient has the following medical conditions:  There are no active problems to display for this patient.   Chief Complaint  Patient presents with   SEXUALLY TRANSMITTED DISEASE    Screening    HPI HPI Patient reports to clinic for STI testing. Denies any symptoms. States that one of his partners said she has discharge but does not want to come get tested.  STI screening history: Last HIV test per patient/review of record was No results found for: HMHIVSCREEN No results found for: HIV  Last HEPC test per patient/review of record was No results found for: HMHEPCSCREEN No components found for: HEPC   Last HEPB test per patient/review of record was No components found for: HMHEPBSCREEN   Fertility: Does the patient or their partner desires a pregnancy in the next year? No  Screening for MPX risk: Does the patient have an unexplained rash? No Is the patient MSM? No Does the patient endorse multiple sex partners or anonymous sex partners? No Did the patient have close or sexual contact with a person diagnosed with MPX? No Has the patient traveled outside the [place]  where MPX is endemic? No Is there a high clinical suspicion for MPX-- evidenced by one of the following No  -Unlikely to be chickenpox  -Lymphadenopathy  -Rash that present in same phase of evolution on any given body part   See flowsheet for further details and programmatic requirements.   Immunization History  Administered Date(s) Administered   PFIZER(Purple Top)SARS-COV-2 Vaccination 01/11/2020, 02/05/2020     The following portions of the patient's history were reviewed and updated as appropriate: allergies,  current medications, past medical history, past social history, past surgical history and problem list.  Objective:  There were no vitals filed for this visit.  Physical Exam Vitals and nursing note reviewed.  Constitutional:      Appearance: Normal appearance.  HENT:     Head: Normocephalic and atraumatic.     Mouth/Throat:     Mouth: Mucous membranes are moist.     Pharynx: No oropharyngeal exudate or posterior oropharyngeal erythema.  Eyes:     General:        Right eye: No discharge.        Left eye: No discharge.     Conjunctiva/sclera:     Right eye: Right conjunctiva is not injected. No exudate.    Left eye: Left conjunctiva is not injected. No exudate. Pulmonary:     Effort: Pulmonary effort is normal.  Abdominal:     General: Abdomen is flat.     Palpations: Abdomen is soft. There is no hepatomegaly or mass.     Tenderness: There is no abdominal tenderness. There is no rebound.  Genitourinary:    Comments: Declined genital exam- asymptomatic Lymphadenopathy:     Cervical: No cervical adenopathy.     Upper Body:     Right upper body: No supraclavicular or axillary adenopathy.     Left upper body: No supraclavicular or axillary adenopathy.  Skin:    General: Skin is warm and dry.  Neurological:     Mental Status: He is alert and oriented to person, place, and time.     Assessment and Plan:  Brent Gibson is a 53 y.o. male presenting to the [facility] Department for STI screening  1. Screening for venereal disease (Primary)  - Chlamydia/GC NAA, Confirmation - HIV [facility] - Syphilis Serology, [facility]   Patient does not have STI symptoms Patient accepted all screenings including  urine GC/Chlamydia, and blood work for HIV/Syphilis. Patient meets criteria for HepB screening? No. Ordered? not applicable Patient meets criteria for HepC screening? No. Ordered? not applicable Recommended condom use with all sex Discussed importance of  condom use for STI prevention  Treat positive test results per standing order. Discussed time line for [facility] results and that patient will be called with positive results and encouraged patient to call if he had not heard in 2 weeks Recommended repeat testing in 3 months with positive results. Recommended returning for continued or worsening symptoms.   Return if symptoms worsen or fail to improve, for STI screening.  Future Appointments  Date Time Provider Department Center  10/20/2023 10:40 AM Veda Hummingbird, FNP [facility] None    Hummingbird Veda, [place]

## 2023-10-24 LAB — CHLAMYDIA/GC NAA, CONFIRMATION
Chlamydia trachomatis, NAA: NEGATIVE
Neisseria gonorrhoeae, NAA: NEGATIVE

## 2024-03-02 NOTE — Progress Notes (Signed)
 Sleep Medicine   Office Visit  Patient Name: Brent Gibson DOB: May 28, 1971 MRN [id number]    Chief Complaint: establish care for OSA  Brief History:  Brent Gibson presents for an initial consult for sleep evaluation and to establish care. The patient has a 10 year history of sleep apnea and is currently on a CPAP. Prior to using a PAP, sleep quality was poor. This was noted every night. The patient reported the following symptoms:  snoring, gasping, witnessed apneic pauses, and choking. The patient goes to sleep at 1030 pm and wakes up at 0530 am and will wake up in between at least 2 times to use the restroom. The patient no history of psychiatric problems. The Epworth Sleepiness Score is 5 out of 24 . The patient relates  Cardiovascular risk factors include: HTN. The patient is currently on a BiPAP@ 25/16 cmH2O. The patient reports not using his PAP as he is in need of new supplies. Patient reports that he feels rested after sleeping with PAP.  The patient reports benefiting from PAP use and would like for him to continue using PAP. Reported sleepiness is  improved with PAP. The compliance download shows 0% compliance with an average use time of 1 hours 17  minutes. The AHI is 7.0. Leakage is 57.5.  95th % pressure is 25/16 cmH2O.  The patient continues to require PAP therapy as a medical necessity in order to eliminate his sleep apnea.     ROS  General: (-) fever, (-) chills, (-) night sweat Nose and Sinuses: (-) nasal stuffiness or itchiness, (-) postnasal drip, (-) nosebleeds, (-) sinus trouble. Mouth and Throat: (-) sore throat, (-) hoarseness. Neck: (-) swollen glands, (-) enlarged thyroid, (-) neck pain. Respiratory: - cough, - shortness of breath, - wheezing. Neurologic: - numbness, - tingling. Psychiatric: - anxiety, - depression Sleep behavior: -sleep paralysis -hypnogogic hallucinations -dream enactment      -vivid dreams -cataplexy -night terrors -sleep walking   Current  Medication: Outpatient Encounter Medications as of 03/05/2024  Medication Sig   hydrochlorothiazide (HYDRODIURIL) 25 MG tablet Take 25 mg by mouth daily.   ibuprofen  (ADVIL ) 800 MG tablet Take 800 mg by mouth 3 (three) times daily.   JARDIANCE 25 MG TABS tablet Take 25 mg by mouth daily.   VIAGRA 100 MG tablet Take by mouth.   atorvastatin (LIPITOR) 40 MG tablet Take 40 mg by mouth daily.   chlorthalidone (HYGROTON) 25 MG tablet Take 25 mg by mouth daily.   cyclobenzaprine  (FLEXERIL ) 10 MG tablet Take 1 tablet (10 mg total) by mouth 3 (three) times daily as needed. (Patient not taking: Reported on 10/20/2023)   glipiZIDE (GLUCOTROL XL) 5 MG 24 hr tablet Take 5 mg by mouth daily with breakfast.   lisinopril (ZESTRIL) 20 MG tablet Take 20 mg by mouth daily.   [DISCONTINUED] empagliflozin (JARDIANCE) 10 MG TABS tablet Take by mouth daily.   [DISCONTINUED] ibuprofen  (ADVIL ) 600 MG tablet Take 1 tablet (600 mg total) by mouth every 8 (eight) hours as needed.   [DISCONTINUED] lisinopril (ZESTRIL) 5 MG tablet Take 5 mg by mouth daily.   [DISCONTINUED] traMADol  (ULTRAM ) 50 MG tablet Take 1 tablet (50 mg total) by mouth every 6 (six) hours as needed for moderate pain.   No facility-administered encounter medications on file as of 03/05/2024.    Surgical History: No past surgical history on file.  Medical History: Past Medical History:  Diagnosis Date   Diabetes mellitus without complication (HCC)    Gout  Hypertension     Family History: Non contributory to the present illness  Social History: Social History   Socioeconomic History   Marital status: Single    Spouse name: Not on file   Number of children: Not on file   Years of education: Not on file   Highest education level: Not on file  Occupational History   Not on file  Tobacco Use   Smoking status: Never   Smokeless tobacco: Never  Substance and Sexual Activity   Alcohol use: No   Drug use: No   Sexual activity: Not on  file  Other Topics Concern   Not on file  Social History Narrative   Not on file   Social Drivers of Health   Financial Resource Strain: Not on file  Food Insecurity: Not on file  Transportation Needs: Not on file  Physical Activity: Not on file  Stress: Not on file  Social Connections: Not on file  Intimate Partner Violence: Not on file    Vital Signs: Blood pressure 137/89, pulse 79, resp. rate 16, height 5' 11 (1.803 m), weight (!) 352 lb (159.7 kg), SpO2 98%. Body mass index is 49.09 kg/m.   Examination: General Appearance: The patient is well-developed, well-nourished, and in no distress. Neck Circumference: 48 cm Skin: Gross inspection of skin unremarkable. Head: normocephalic, no gross deformities. Eyes: no gross deformities noted. ENT: ears appear grossly normal Neurologic: Alert and oriented. No involuntary movements.    STOP BANG RISK ASSESSMENT S (snore) Have you been told that you snore?     YES   T (tired) Are you often tired, fatigued, or sleepy during the day?   YES  O (obstruction) Do you stop breathing, choke, or gasp during sleep? YES   P (pressure) Do you have or are you being treated for high blood pressure? YES   B (BMI) Is your body index greater than 35 kg/m? YES   A (age) Are you 54 years old or older? YES   N (neck) Do you have a neck circumference greater than 16 inches?   YES   G (gender) Are you a male? YES   TOTAL STOP/BANG "YES" ANSWERS 8                                                               A STOP-Bang score of 2 or less is considered low risk, and a score of 5 or more is high risk for having either moderate or severe OSA. For people who score 3 or 4, doctors may need to perform further assessment to determine how likely they are to have OSA.         EPWORTH SLEEPINESS SCALE:  Scale:  (0)= no chance of dozing; (1)= slight chance of dozing; (2)= moderate chance of dozing; (3)= high chance of dozing  Chance   Situtation    Sitting and reading: 0    Watching TV: 1    Sitting Inactive in public: 0    As a passenger in car: 1      Lying down to rest: 2    Sitting and talking: 0    Sitting quielty after lunch: 1    In a car, stopped in traffic: 0   TOTAL SCORE:   5 out  of 24    SLEEP STUDIES:  HST (03/2023) AHI 51/hr, Supine AHI 63/hr, min SpO2 84% No titration on file   LABS: No results found for this or any previous visit (from the past 2160 hours).  Radiology: No results found.  No results found.  No results found.    Assessment and Plan: Patient Active Problem List   Diagnosis Date Noted   OSA (obstructive sleep apnea) 03/05/2024   Encounter for BiPAP use counseling 03/05/2024   Morbid obesity (HCC) 03/05/2024   1. OSA (obstructive sleep apnea) (Primary)  Patient evaluation reveals severe OSA with AHI of 50. He has not been able to use his bipap of late due to outdated supplies. He does not want a new machine. He will get supplies, and follow up in 73m to ensure his apnea is controlled at his current pressure of bipap 25/16.  Patient has comorbid cardiovascular risk factors including: hypertension which could be exacerbated by pathologic sleep-disordered breathing.  Suggest: Resume bipap 25/16 treat the patient's sleep disordered breathing. The patient was also counselled on weight loss to optimize sleep health. 2. Encounter for BiPAP use counseling PAP Counseling: had a lengthy discussion with the patient regarding the importance of PAP therapy in management of the sleep apnea. Patient appears to understand the risk factor reduction and also understands the risks associated with untreated sleep apnea. Patient will try to make a good faith effort to remain compliant with therapy. Also instructed the patient on proper cleaning of the device including the water must be changed daily if possible and use of distilled water is preferred. Patient understands that the machine  should be regularly cleaned with appropriate recommended cleaning solutions that do not damage the PAP machine for example given white vinegar and water rinses. Other methods such as ozone treatment may not be as good as these simple methods to achieve cleaning.   3. Morbid obesity (HCC) Obesity Counseling: Had a lengthy discussion regarding patients BMI and weight issues. Patient was instructed on portion control as well as increased activity. Also discussed caloric restrictions with trying to maintain intake less than 2000 Kcal. Discussions were made in accordance with the 5As of weight management. Simple actions such as not eating late and if able to, taking a walk is suggested.    General Counseling: I have discussed the findings of the evaluation and examination with [facility].  I have also discussed any further diagnostic evaluation thatmay be needed or ordered today. Andy verbalizes understanding of the findings of todays visit. We also reviewed his medications today and discussed drug interactions and side effects including but not limited excessive drowsiness and altered mental states. We also discussed that there is always a risk not just to him but also people around him. he has been encouraged to call the office with any questions or concerns that should arise related to todays visit.  No orders of the defined types were placed in this encounter.       I have personally obtained a history, evaluated the patient, evaluated pertinent data, formulated the assessment and plan and placed orders. This patient was seen today by Lauraine Lay, PA-C in collaboration with Dr. Elfreda Bathe.     Elfreda DELENA Bathe, MD [facility] Diplomate ABMS Pulmonary and Critical Care Medicine Sleep medicine

## 2024-03-05 ENCOUNTER — Ambulatory Visit (INDEPENDENT_AMBULATORY_CARE_PROVIDER_SITE_OTHER): Payer: Self-pay | Admitting: Internal Medicine

## 2024-03-05 VITALS — BP 137/89 | HR 79 | Resp 16 | Ht 71.0 in | Wt 352.0 lb

## 2024-03-05 DIAGNOSIS — G4733 Obstructive sleep apnea (adult) (pediatric): Secondary | ICD-10-CM

## 2024-03-05 DIAGNOSIS — Z7189 Other specified counseling: Secondary | ICD-10-CM | POA: Diagnosis not present

## 2024-03-05 NOTE — Patient Instructions (Signed)

## 2024-06-22 NOTE — Progress Notes (Deleted)
 [facility] [address] [place], [place] [address]  Pulmonary Sleep Medicine   Office Visit Note  Patient Name: Brent Gibson DOB: Jul 04, 1971 MRN [id number]    Chief Complaint: Obstructive Sleep Apnea visit  Brief History:  Brent Gibson is seen today for a 2 month follow up visit for BiPAP@ 25/16 cmH2O. The patient has a 10 year history of sleep apnea. Patient *** using PAP nightly.  The patient feels *** after sleeping with PAP.  The patient reports *** from PAP use. Reported sleepiness is  *** and the Epworth Sleepiness Score is *** out of 24. The patient *** take naps. The patient complains of the following: ***  The compliance download shows  compliance with an average use time of *** hours. The AHI is ***  The patient *** of limb movements disrupting sleep. The patient continues to require PAP therapy in order to eliminate sleep apnea.   ROS  General: (-) fever, (-) chills, (-) night sweat Nose and Sinuses: (-) nasal stuffiness or itchiness, (-) postnasal drip, (-) nosebleeds, (-) sinus trouble. Mouth and Throat: (-) sore throat, (-) hoarseness. Neck: (-) swollen glands, (-) enlarged thyroid, (-) neck pain. Respiratory: *** cough, *** shortness of breath, *** wheezing. Neurologic: *** numbness, *** tingling. Psychiatric: *** anxiety, *** depression   Current Medication: Outpatient Encounter Medications as of 06/25/2024  Medication Sig   atorvastatin (LIPITOR) 40 MG tablet Take 40 mg by mouth daily.   chlorthalidone (HYGROTON) 25 MG tablet Take 25 mg by mouth daily.   cyclobenzaprine  (FLEXERIL ) 10 MG tablet Take 1 tablet (10 mg total) by mouth 3 (three) times daily as needed. (Patient not taking: Reported on 10/20/2023)   glipiZIDE (GLUCOTROL XL) 5 MG 24 hr tablet Take 5 mg by mouth daily with breakfast.   hydrochlorothiazide (HYDRODIURIL) 25 MG tablet Take 25 mg by mouth daily.   ibuprofen  (ADVIL ) 800 MG tablet Take 800 mg by mouth 3 (three) times daily.   JARDIANCE  25 MG TABS tablet Take 25 mg by mouth daily.   lisinopril (ZESTRIL) 20 MG tablet Take 20 mg by mouth daily.   VIAGRA 100 MG tablet Take by mouth.   No facility-administered encounter medications on file as of 06/25/2024.    Surgical History: No past surgical history on file.  Medical History: Past Medical History:  Diagnosis Date   Diabetes mellitus without complication (HCC)    Gout    Hypertension     Family History: Non contributory to the present illness  Social History: Social History   Socioeconomic History   Marital status: Single    Spouse name: Not on file   Number of children: Not on file   Years of education: Not on file   Highest education level: Not on file  Occupational History   Not on file  Tobacco Use   Smoking status: Never   Smokeless tobacco: Never  Substance and Sexual Activity   Alcohol use: No   Drug use: No   Sexual activity: Not on file  Other Topics Concern   Not on file  Social History Narrative   Not on file   Social Drivers of Health   Financial Resource Strain: Not on file  Food Insecurity: Not on file  Transportation Needs: Not on file  Physical Activity: Not on file  Stress: Not on file  Social Connections: Not on file  Intimate Partner Violence: Not on file    Vital Signs: There were no vitals taken for this visit. There  is no height or weight on file to calculate BMI.    Examination: General Appearance: The patient is well-developed, well-nourished, and in no distress. Neck Circumference: 48 cm Skin: Gross inspection of skin unremarkable. Head: normocephalic, no gross deformities. Eyes: no gross deformities noted. ENT: ears appear grossly normal Neurologic: Alert and oriented. No involuntary movements.  STOP BANG RISK ASSESSMENT S (snore) Have you been told that you snore?     YES/N   T (tired) Are you often tired, fatigued, or sleepy during the day?   YES/NO  O (obstruction) Do you stop breathing, choke, or  gasp during sleep? YES/NO   P (pressure) Do you have or are you being treated for high blood pressure? YES   B (BMI) Is your body index greater than 35 kg/m? YES   A (age) Are you 86 years old or older? YES   N (neck) Do you have a neck circumference greater than 16 inches?   YES   G (gender) Are you a male? YES   TOTAL STOP/BANG "YES" ANSWERS        A STOP-Bang score of 2 or less is considered low risk, and a score of 5 or more is high risk for having either moderate or severe OSA. For people who score 3 or 4, doctors may need to perform further assessment to determine how likely they are to have OSA.         EPWORTH SLEEPINESS SCALE:  Scale:  (0)= no chance of dozing; (1)= slight chance of dozing; (2)= moderate chance of dozing; (3)= high chance of dozing  Chance  Situtation    Sitting and reading: ***    Watching TV: ***    Sitting Inactive in public: ***    As a passenger in car: ***      Lying down to rest: ***    Sitting and talking: ***    Sitting quielty after lunch: ***    In a car, stopped in traffic: ***   TOTAL SCORE:   *** out of 24    SLEEP STUDIES:  HST (03/2023) AHI 51/hr, Supine AHI 63/hr, min SpO2 84% No titration on file   CPAP COMPLIANCE DATA:  Date Range: ***  Average Daily Use: *** hours  Median Use: ***  Compliance for > 4 Hours: *** days  AHI: *** respiratory events per hour  Days Used: ***  Mask Leak: ***  95th Percentile Pressure: ***         LABS: No results found for this or any previous visit (from the past 2160 hours).  Radiology: No results found.  No results found.  No results found.    Assessment and Plan: Patient Active Problem List   Diagnosis Date Noted   OSA (obstructive sleep apnea) 03/05/2024   Encounter for BiPAP use counseling 03/05/2024   Morbid obesity (HCC) 03/05/2024      The patient *** tolerate PAP and reports *** benefit from PAP use. The patient was reminded how to ***  and advised to ***. The patient was also counselled on ***. The compliance is ***. The AHI is ***.   ***  General Counseling: I have discussed the findings of the evaluation and examination with [facility].  I have also discussed any further diagnostic evaluation thatmay be needed or ordered today. Jerauld verbalizes understanding of the findings of todays visit. We also reviewed his medications today and discussed drug interactions and side effects including but not limited excessive drowsiness and altered mental states.  We also discussed that there is always a risk not just to him but also people around him. he has been encouraged to call the office with any questions or concerns that should arise related to todays visit.  No orders of the defined types were placed in this encounter.       I have personally obtained a history, examined the patient, evaluated laboratory and imaging results, formulated the assessment and plan and placed orders.  Elfreda DELENA Bathe, MD [facility] Diplomate ABMS Pulmonary Critical Care Medicine and Sleep Medicine

## 2024-06-25 ENCOUNTER — Ambulatory Visit
